# Patient Record
Sex: Male | Born: 1990 | Race: White | Hispanic: No | Marital: Single | Smoking: Current some day smoker
Health system: Southern US, Community
[De-identification: ages and names within clinical notes are randomized; demographics above are authoritative.]

## PROBLEM LIST (undated history)

## (undated) DIAGNOSIS — R569 Unspecified convulsions: Secondary | ICD-10-CM

---
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
## 2010-10-31 ENCOUNTER — Emergency Department (HOSPITAL_COMMUNITY)
Admission: EM | Admit: 2010-10-31 | Discharge: 2010-10-31 | Disposition: A | Discharge status: Home / Self Care | Payer: BC Managed Care – PPO | Attending: Emergency Medicine | Admitting: Emergency Medicine

## 2010-10-31 ENCOUNTER — Emergency Department (HOSPITAL_COMMUNITY): Payer: BC Managed Care – PPO

## 2010-10-31 DIAGNOSIS — R32 Unspecified urinary incontinence: Secondary | ICD-10-CM | POA: Insufficient documentation

## 2010-10-31 DIAGNOSIS — R42 Dizziness and giddiness: Secondary | ICD-10-CM | POA: Insufficient documentation

## 2010-10-31 DIAGNOSIS — R55 Syncope and collapse: Secondary | ICD-10-CM | POA: Insufficient documentation

## 2010-10-31 DIAGNOSIS — R110 Nausea: Secondary | ICD-10-CM | POA: Insufficient documentation

## 2010-10-31 LAB — POCT I-STAT, CHEM 8
BUN: 9 mg/dL (ref 6–23)
Calcium, Ion: 1.18 mmol/L (ref 1.12–1.32)
Chloride: 99 mEq/L (ref 96–112)
Creatinine, Ser: 1.1 mg/dL (ref 0.50–1.35)
Glucose, Bld: 97 mg/dL (ref 70–99)
HCT: 49 % (ref 39.0–52.0)
Hemoglobin: 16.7 g/dL (ref 13.0–17.0)
Potassium: 3.3 mEq/L — ABNORMAL LOW (ref 3.5–5.1)
Sodium: 138 mEq/L (ref 135–145)
TCO2: 26 mmol/L (ref 0–100)

## 2012-09-25 IMAGING — CT CT HEAD W/O CM
1 of 2 series · 13 of 30 positions shown, 17 images · non-contrast
Comparison: None.

CLINICAL DATA: Syncope; urinary incontinence.  Question of seizure.

CT HEAD WITHOUT CONTRAST
TECHNIQUE: Contiguous axial images were obtained from the base of
the skull through the vertex without contrast.

[Series 2: brain · axial · 0.47mm/px · z∈[+2,+123]mm · 13 of 28 slices shown, 17 images]
[im 2/28  brain]
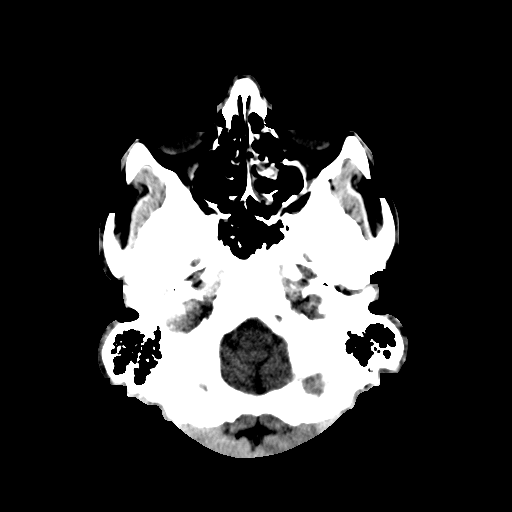
[im 2/28  bone]
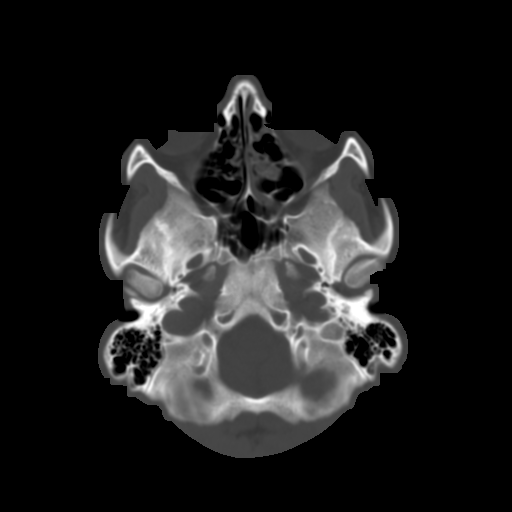
[im 4/28  brain]
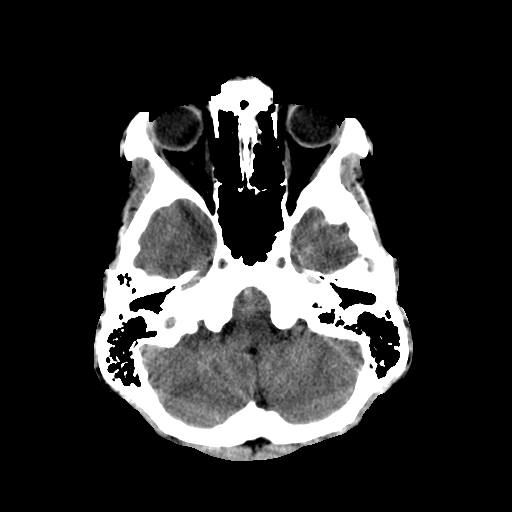
[im 6/28  brain]
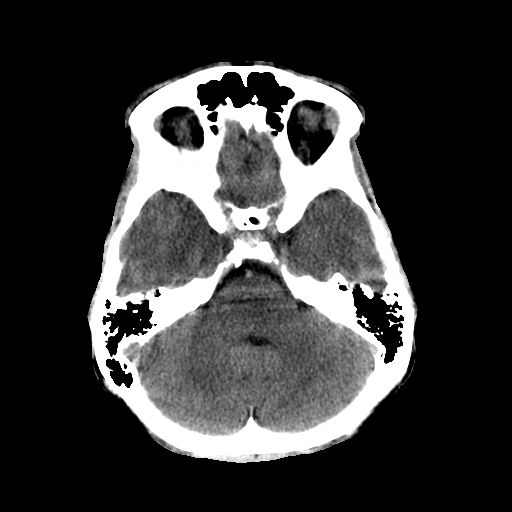
[im 8/28  brain]
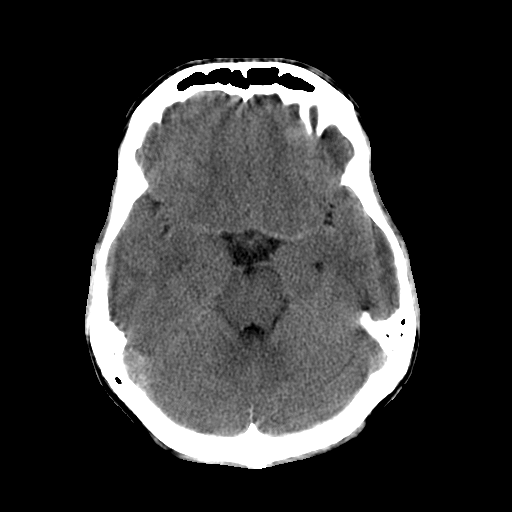
[im 10/28  brain]
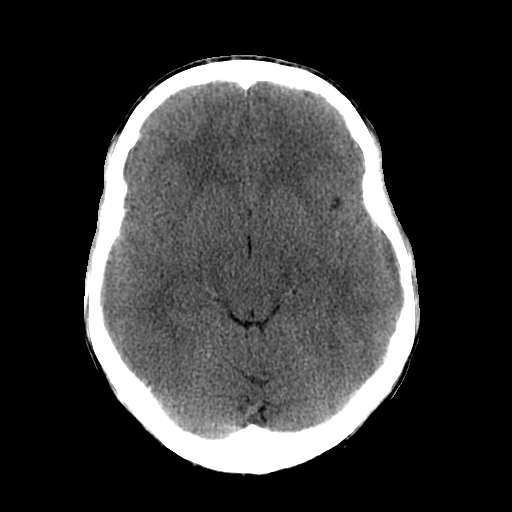
[im 10/28  bone]
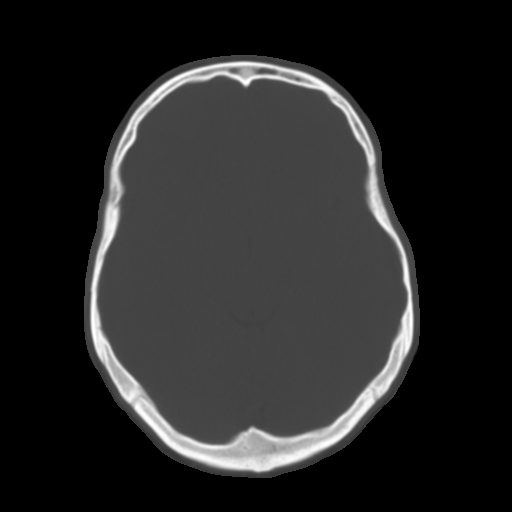
[im 12/28  brain]
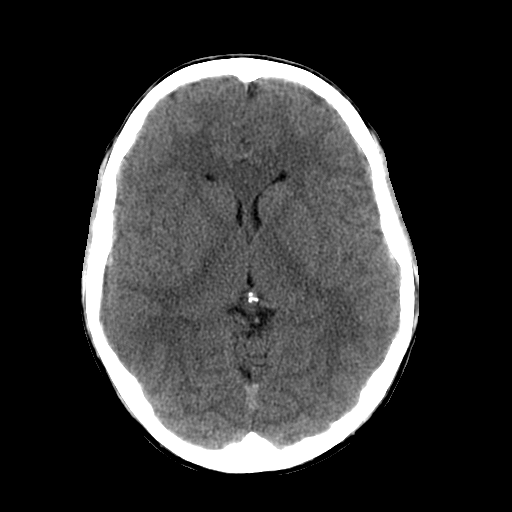
[im 14/28  brain]
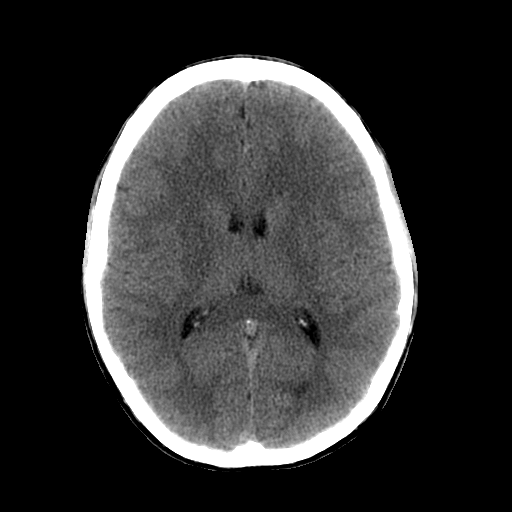
[im 16/28  brain]
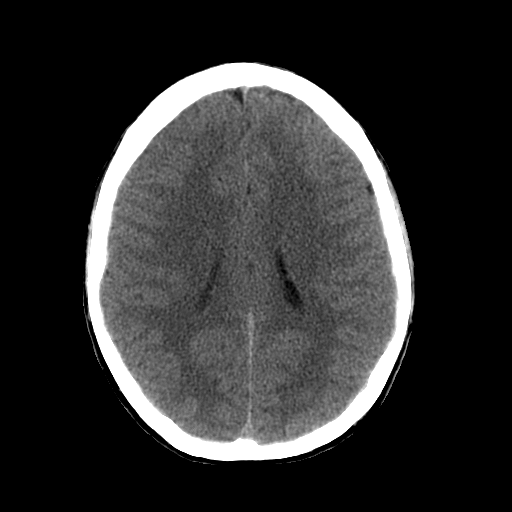
[im 18/28  brain]
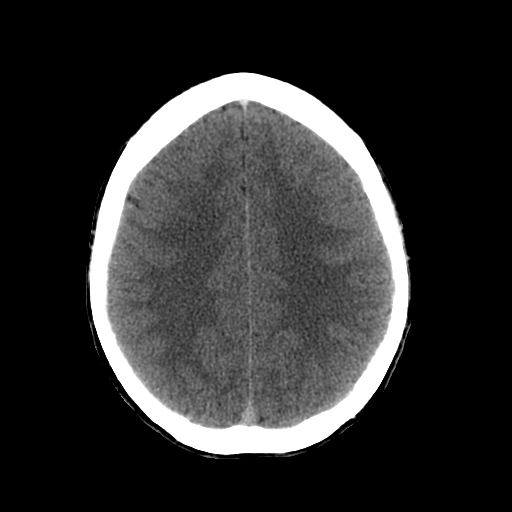
[im 18/28  bone]
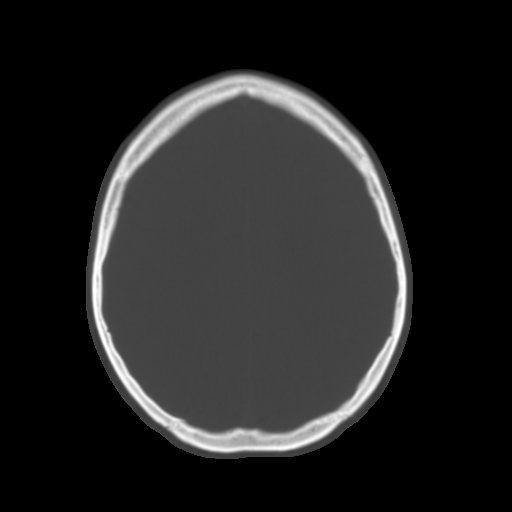
[im 20/28  brain]
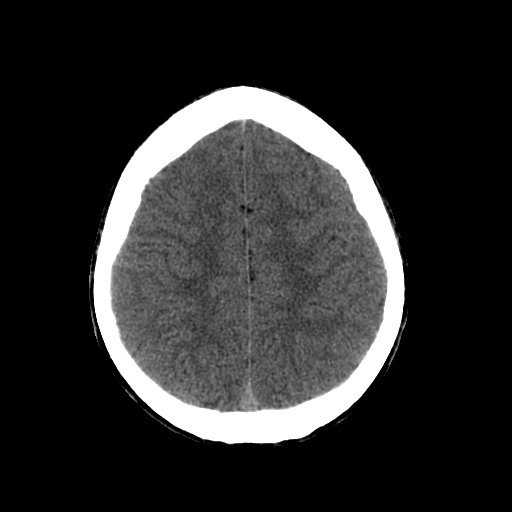
[im 22/28  brain]
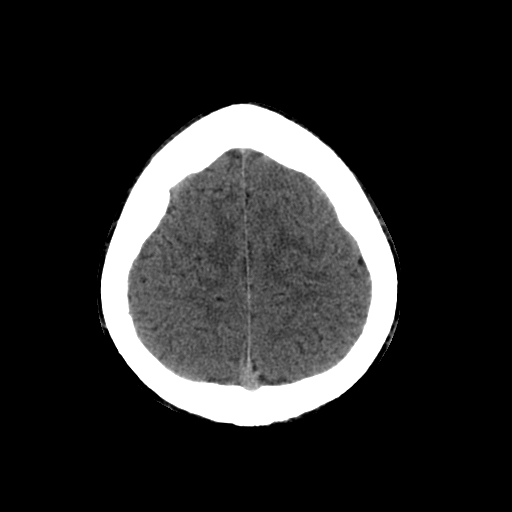
[im 24/28  brain]
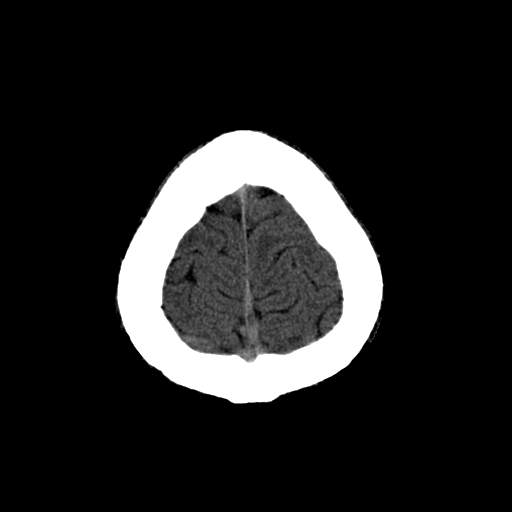
[im 26/28  brain]
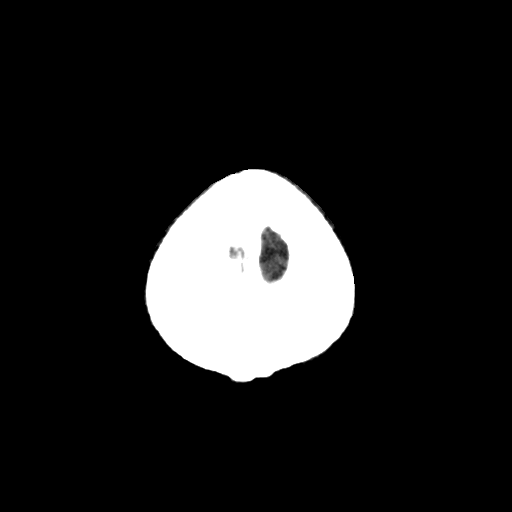
[im 26/28  bone]
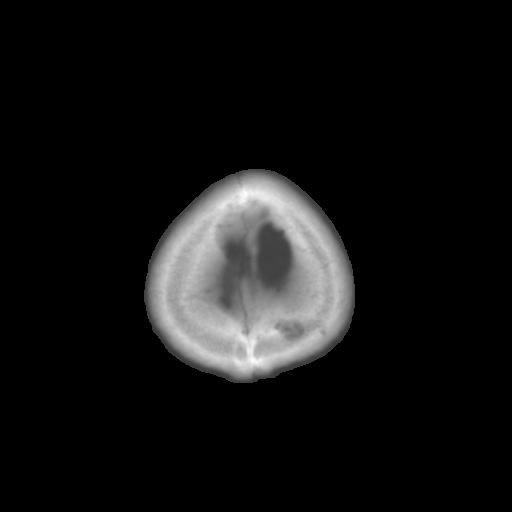

[13 of 30 positions shown; findings below may reference images not displayed]

FINDINGS: There is no evidence of acute infarction, mass lesion, or
intra- or extra-axial hemorrhage on CT.

The posterior fossa, including the cerebellum, brainstem and fourth
ventricle, is within normal limits.  The third and lateral
ventricles, and basal ganglia are unremarkable in appearance.  The
cerebral hemispheres are symmetric in appearance, with normal gray-
white differentiation.  No mass effect or midline shift is seen.

There is no evidence of fracture; visualized osseous structures are
unremarkable in appearance.  The visualized portions of the orbits
are within normal limits.  The paranasal sinuses and mastoid air
cells are well-aerated.  No significant soft tissue abnormalities
are seen.
IMPRESSION: Unremarkable noncontrast CT of the head.

## 2014-08-27 ENCOUNTER — Encounter (HOSPITAL_COMMUNITY): Payer: Self-pay | Admitting: Emergency Medicine

## 2014-08-27 ENCOUNTER — Emergency Department (HOSPITAL_COMMUNITY)
Admission: EM | Admit: 2014-08-27 | Discharge: 2014-08-27 | Disposition: A | Discharge status: Home / Self Care | Payer: No Typology Code available for payment source | Attending: Emergency Medicine | Admitting: Emergency Medicine

## 2014-08-27 DIAGNOSIS — S0990XA Unspecified injury of head, initial encounter: Secondary | ICD-10-CM | POA: Diagnosis present

## 2014-08-27 DIAGNOSIS — Y9389 Activity, other specified: Secondary | ICD-10-CM | POA: Insufficient documentation

## 2014-08-27 DIAGNOSIS — Y92410 Unspecified street and highway as the place of occurrence of the external cause: Secondary | ICD-10-CM | POA: Insufficient documentation

## 2014-08-27 DIAGNOSIS — Z79899 Other long term (current) drug therapy: Secondary | ICD-10-CM | POA: Insufficient documentation

## 2014-08-27 DIAGNOSIS — Y998 Other external cause status: Secondary | ICD-10-CM | POA: Diagnosis not present

## 2014-08-27 DIAGNOSIS — Z041 Encounter for examination and observation following transport accident: Secondary | ICD-10-CM | POA: Diagnosis not present

## 2014-08-27 DIAGNOSIS — Z720 Tobacco use: Secondary | ICD-10-CM | POA: Diagnosis not present

## 2014-08-27 HISTORY — DX: Unspecified convulsions: R56.9
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
